# Patient Record
Sex: Female | Born: 1970 | Race: White | Hispanic: No | Marital: Single | Smoking: Current every day smoker
Health system: Southern US, Community
[De-identification: ages and names within clinical notes are randomized; demographics above are authoritative.]

## PROBLEM LIST (undated history)

## (undated) HISTORY — PX: ABDOMINAL HYSTERECTOMY: SHX81

## (undated) HISTORY — PX: CHOLECYSTECTOMY: SHX55

## (undated) HISTORY — PX: APPENDECTOMY: SHX54

---

## 2015-12-07 ENCOUNTER — Encounter: Payer: Self-pay | Admitting: Emergency Medicine

## 2015-12-07 ENCOUNTER — Emergency Department
Admission: EM | Admit: 2015-12-07 | Discharge: 2015-12-07 | Disposition: A | Discharge status: Home / Self Care | Payer: Self-pay | Attending: Emergency Medicine | Admitting: Emergency Medicine

## 2015-12-07 ENCOUNTER — Emergency Department: Payer: Self-pay

## 2015-12-07 DIAGNOSIS — R51 Headache: Secondary | ICD-10-CM | POA: Insufficient documentation

## 2015-12-07 DIAGNOSIS — F17200 Nicotine dependence, unspecified, uncomplicated: Secondary | ICD-10-CM | POA: Insufficient documentation

## 2015-12-07 DIAGNOSIS — R519 Headache, unspecified: Secondary | ICD-10-CM

## 2015-12-07 LAB — COMPREHENSIVE METABOLIC PANEL
ALBUMIN: 4.2 g/dL (ref 3.5–5.0)
ALT: 34 U/L (ref 14–54)
AST: 27 U/L (ref 15–41)
Alkaline Phosphatase: 70 U/L (ref 38–126)
Anion gap: 6 (ref 5–15)
BUN: 16 mg/dL (ref 6–20)
CHLORIDE: 105 mmol/L (ref 101–111)
CO2: 25 mmol/L (ref 22–32)
Calcium: 9.4 mg/dL (ref 8.9–10.3)
Creatinine, Ser: 0.78 mg/dL (ref 0.44–1.00)
GFR calc Af Amer: >60 mL/min (ref >60)
Glucose, Bld: 96 mg/dL (ref 65–99)
POTASSIUM: 4.2 mmol/L (ref 3.5–5.1)
SODIUM: 136 mmol/L (ref 135–145)
Total Bilirubin: 0.7 mg/dL (ref 0.3–1.2)
Total Protein: 7 g/dL (ref 6.5–8.1)

## 2015-12-07 LAB — CBC WITH DIFFERENTIAL/PLATELET
BASOS ABS: 0.1 10*3/uL (ref 0–0.1)
BASOS PCT: 1 %
EOS ABS: 0.1 10*3/uL (ref 0–0.7)
EOS PCT: 2 %
HCT: 41.7 % (ref 35.0–47.0)
Hemoglobin: 14.5 g/dL (ref 12.0–16.0)
Lymphocytes Relative: 35 %
Lymphs Abs: 2.2 10*3/uL (ref 1.0–3.6)
MCH: 31.9 pg (ref 26.0–34.0)
MCHC: 34.8 g/dL (ref 32.0–36.0)
MCV: 91.7 fL (ref 80.0–100.0)
MONO ABS: 0.7 10*3/uL (ref 0.2–0.9)
Monocytes Relative: 12 %
Neutro Abs: 3.3 10*3/uL (ref 1.4–6.5)
Neutrophils Relative %: 50 %
PLATELETS: 273 10*3/uL (ref 150–440)
RBC: 4.54 MIL/uL (ref 3.80–5.20)
RDW: 13.9 % (ref 11.5–14.5)
WBC: 6.4 10*3/uL (ref 3.6–11.0)

## 2015-12-07 MED ORDER — LORAZEPAM 1 MG PO TABS: 1.0000 mg | ORAL_TABLET | Freq: Two times a day (BID) | ORAL | 0 refills | Status: AC

## 2015-12-07 MED ORDER — LORAZEPAM 2 MG/ML IJ SOLN
1.0000 mg | Freq: Once | INTRAMUSCULAR | Status: AC
  Administered 2015-12-07: 1 mg via INTRAVENOUS
  Filled 2015-12-07: qty 1

## 2015-12-07 MED ORDER — KETOROLAC TROMETHAMINE 30 MG/ML IJ SOLN
30.0000 mg | Freq: Once | INTRAMUSCULAR | Status: AC
  Administered 2015-12-07: 30 mg via INTRAVENOUS
  Filled 2015-12-07: qty 1

## 2015-12-07 MED ORDER — SODIUM CHLORIDE 0.9 % IV SOLN
Freq: Once | INTRAVENOUS | Status: AC
  Administered 2015-12-07: 16:00:00 via INTRAVENOUS

## 2015-12-07 MED ORDER — BUTALBITAL-APAP-CAFFEINE 50-325-40 MG PO TABS: 1.0000 | ORAL_TABLET | Freq: Four times a day (QID) | ORAL | 0 refills | Status: AC | PRN

## 2015-12-07 MED ORDER — METOCLOPRAMIDE HCL 5 MG/ML IJ SOLN
10.0000 mg | Freq: Once | INTRAMUSCULAR | Status: AC
  Administered 2015-12-07: 10 mg via INTRAVENOUS
  Filled 2015-12-07: qty 2

## 2015-12-07 NOTE — ED Triage Notes (Signed)
Pt arrived to ED with c/o of headache that start 10 days ago. Pt states she has had n/v off and on and light sensitivity.

## 2015-12-07 NOTE — ED Notes (Signed)

## 2015-12-07 NOTE — ED Provider Notes (Signed)
[facility] Emergency Department Provider Note        Time seen: ----------------------------------------- 3:21 PM on 12/07/2015 -----------------------------------------    I have reviewed the triage vital signs and the nursing notes.   HISTORY  Chief Complaint Headache    HPI Jodi Riley is a 45 y.o. female who presents to the ER for left-sided headache that started 10 days ago. Patient states she's been under a lot of stress but is not sure if this has caused her headache. Patient states nothing has made it better and she has tried multiple over-the-counter medicines for this.Patient states the headache is around her left eye, light bothers her sound does not.   History reviewed. No pertinent past medical history.  There are no active problems to display for this patient.   Past Surgical History:  Procedure Laterality Date  . ABDOMINAL HYSTERECTOMY    . APPENDECTOMY    . CHOLECYSTECTOMY      Allergies Morphine and related  Social History Social History  Substance Use Topics  . Smoking status: Current Every Day Smoker    Packs/day: 0.50  . Smokeless tobacco: Never Used  . Alcohol use No    Review of Systems Constitutional: Negative for fever. Cardiovascular: Negative for chest pain. Respiratory: Negative for shortness of breath. Gastrointestinal: Negative for abdominal pain, vomiting and diarrhea. Genitourinary: Negative for dysuria. Musculoskeletal: Negative for back pain. Skin: Negative for rash. Neurological:Positive for headache  10-point ROS otherwise negative.  ____________________________________________   PHYSICAL EXAM:  VITAL SIGNS: ED Triage Vitals  Enc Vitals Group     BP 12/07/15 1413 109/83     Pulse Rate 12/07/15 1413 95     Resp 12/07/15 1413 18     Temp 12/07/15 1413 98.3 F (36.8 C)     Temp Source 12/07/15 1413 Oral     SpO2 12/07/15 1413 97 %     Weight 12/07/15 1414 160 lb (72.6 kg)   Height 12/07/15 1414 5\' 4"  (1.626 m)     Head Circumference --      Peak Flow --      Pain Score 12/07/15 1434 8     Pain Loc --      Pain Edu? --      Excl. in GC? --     Constitutional: Alert and oriented. Well appearing and in no distress. Eyes: Conjunctivae are normal. PERRL. Normal extraocular movements.Mild photophobia ENT   Head: Normocephalic and atraumatic.   Nose: No congestion/rhinnorhea.   Mouth/Throat: Mucous membranes are moist.   Neck: No stridor. Cardiovascular: Normal rate, regular rhythm. No murmurs, rubs, or gallops. Respiratory: Normal respiratory effort without tachypnea nor retractions. Breath sounds are clear and equal bilaterally. No wheezes/rales/rhonchi. Gastrointestinal: Soft and nontender. Normal bowel sounds Musculoskeletal: Nontender with normal range of motion in all extremities. No lower extremity tenderness nor edema. Neurologic:  Normal speech and language. No gross focal neurologic deficits are appreciated.  Skin:  Skin is warm, dry and intact. No rash noted. Psychiatric: Mood and affect are normal. Speech and behavior are normal.  ____________________________________________  ED COURSE:  Pertinent labs & imaging results that were available during my care of the patient were reviewed by me and considered in my medical decision making (see chart for details). Clinical Course  Patient is in no distress, we will assess with basic labs and likely CT imaging.  Procedures ____________________________________________   LABS (pertinent positives/negatives)  Labs Reviewed  CBC WITH DIFFERENTIAL/PLATELET  COMPREHENSIVE METABOLIC PANEL    RADIOLOGY  CT head Is unremarkable ____________________________________________  FINAL ASSESSMENT AND PLAN  Headache  Plan: Patient with labs and imaging as dictated above. Patient is in no acute distress, labs and CT are reassuring. Headache is likely stress related. I will prescribe Fioricet  and Ativan. She is stable for outpatient follow-up with her doctor.   Emily Filbert, MD   Note: This dictation was prepared with Dragon dictation. Any transcriptional errors that result from this process are unintentional    Emily Filbert, MD 12/07/15 [phone]

## 2017-10-21 IMAGING — CT CT HEAD W/O CM
3 series · 15 of 45 positions shown, 18 images · non-contrast
Comparison: None.

CLINICAL DATA: Headache for the past 10 days. Intermittent nausea,
vomiting and light sensitivity.

EXAM:
CT HEAD WITHOUT CONTRAST
TECHNIQUE: Contiguous axial images were obtained from the base of the skull
through the vertex without intravenous contrast.

[Series 2: head wo · axial · 0.40mm/px · z∈[-104,+11]mm · 9 of 28 slices shown, 12 images]
[im 3/28  brain]
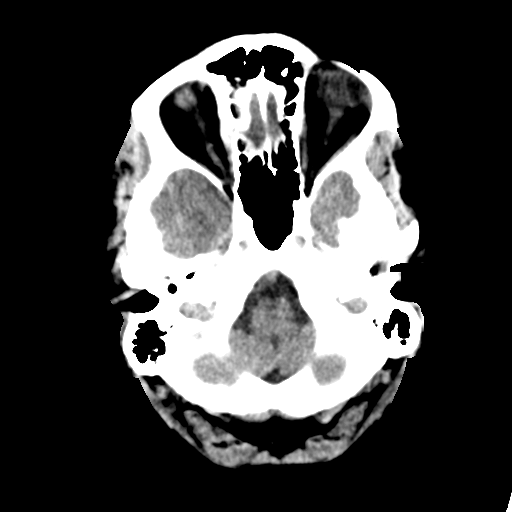
[im 3/28  bone]
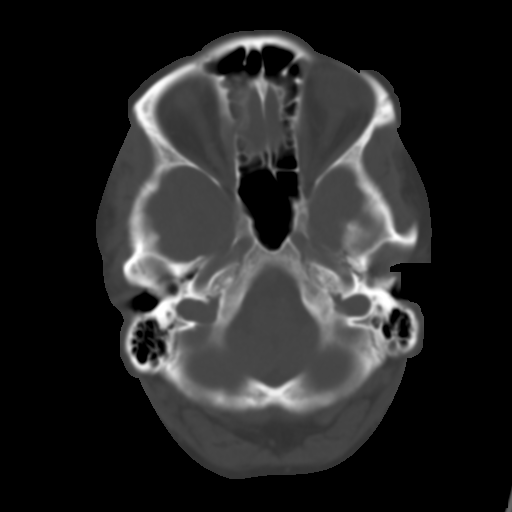
[im 6/28  brain]
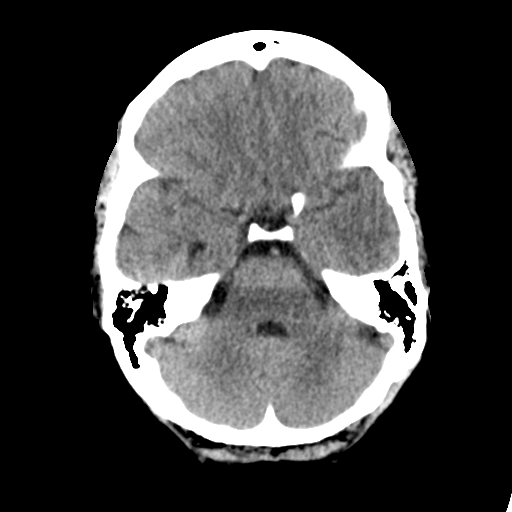
[im 9/28  brain]
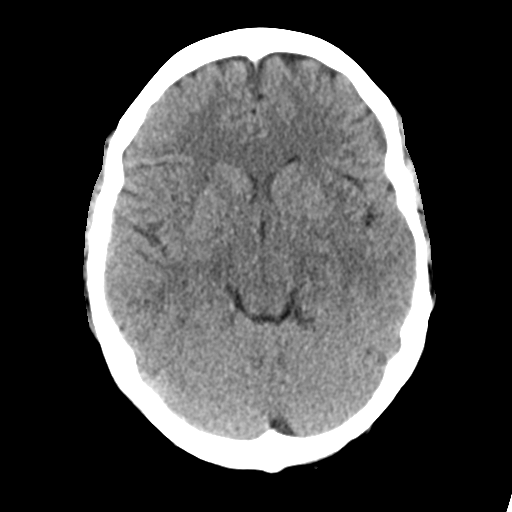
[im 12/28  brain]
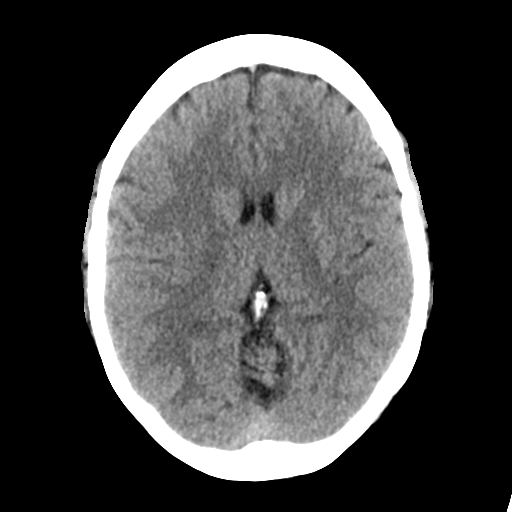
[im 15/28  brain]
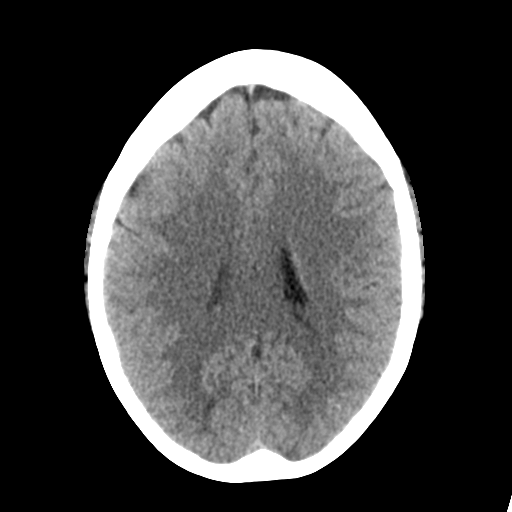
[im 15/28  bone]
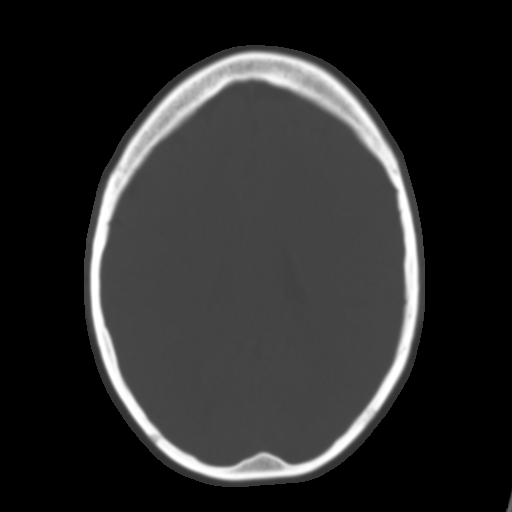
[im 17/28  brain]
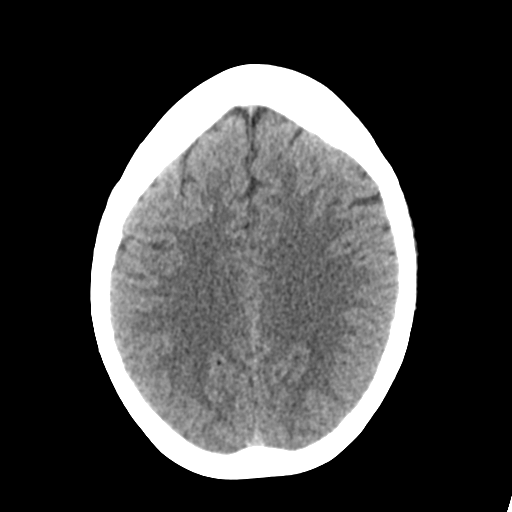
[im 20/28  brain]
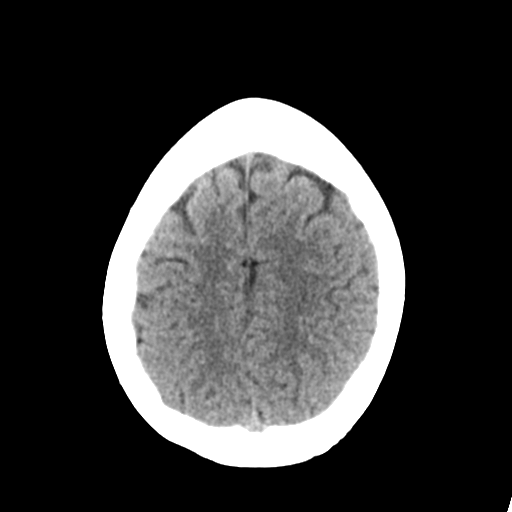
[im 23/28  brain]
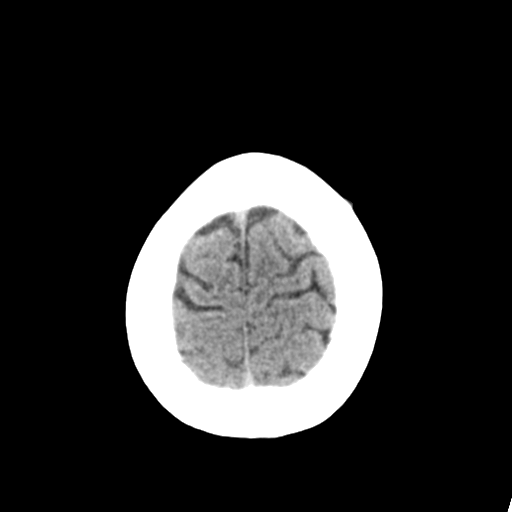
[im 26/28  brain]
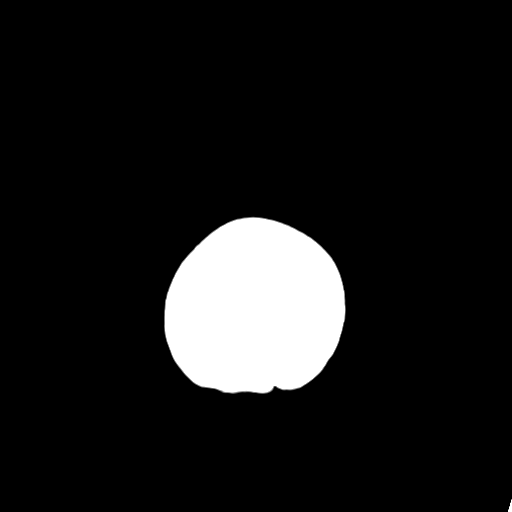
[im 26/28  bone]
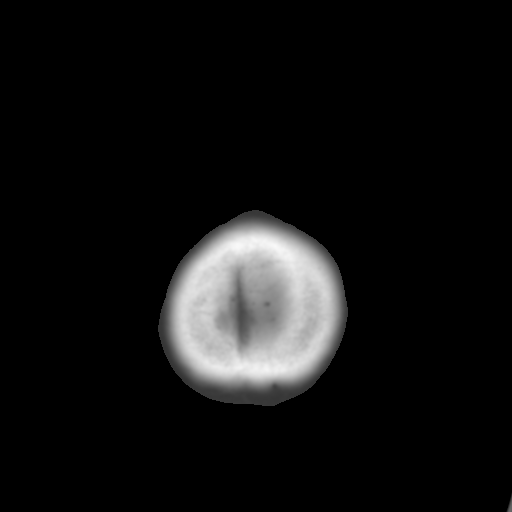

[Series 4: coronal soft tissue · coronal · 0.29mm/px · 3 of 62 slices shown]
[im 21/62  brain]
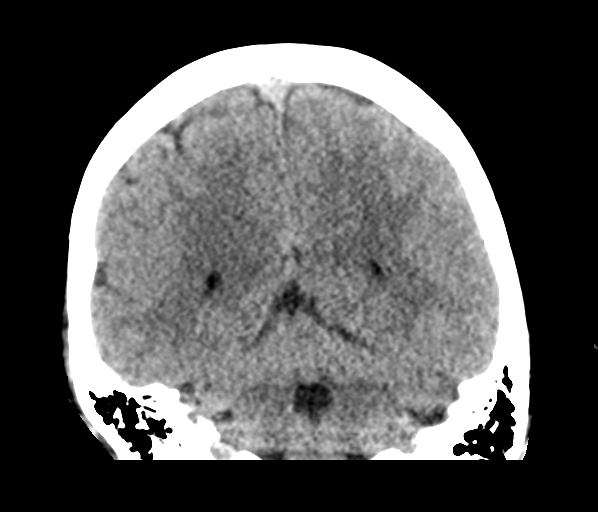
[im 28/62  brain]
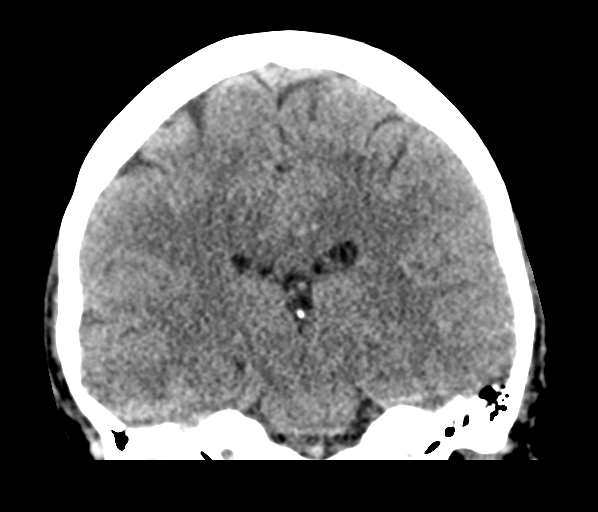
[im 34/62  brain]
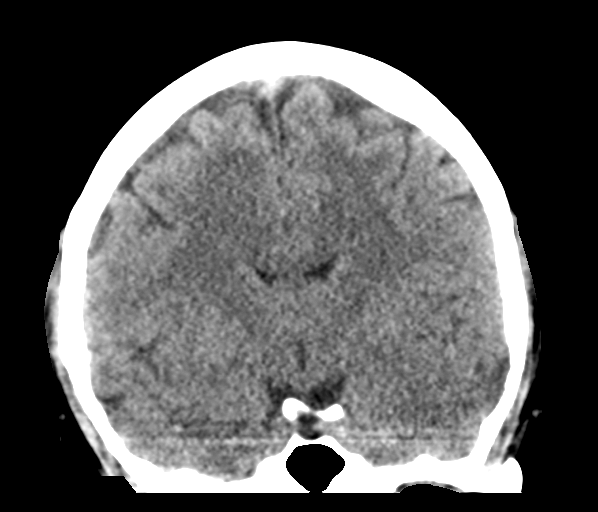

[Series 5: sagittal soft tissue · sagittal · 0.28mm/px · 3 of 49 slices shown]
[im 17/49  brain]
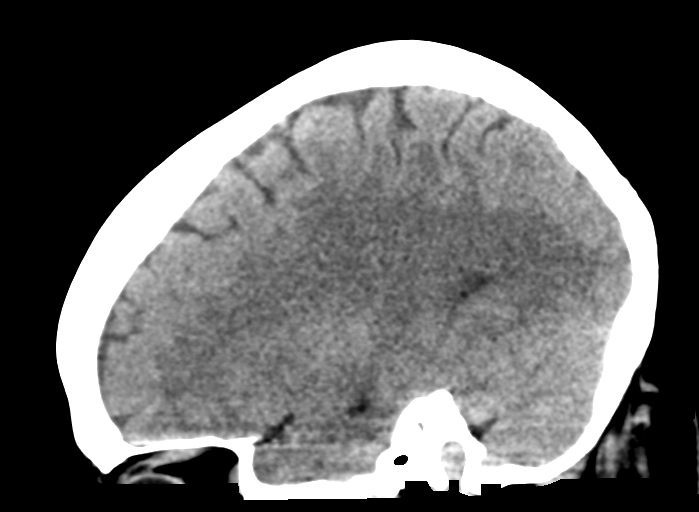
[im 25/49  brain]
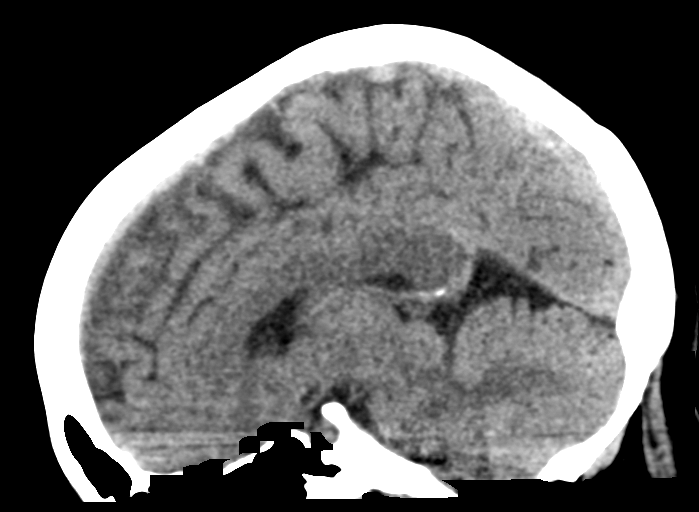
[im 33/49  brain]
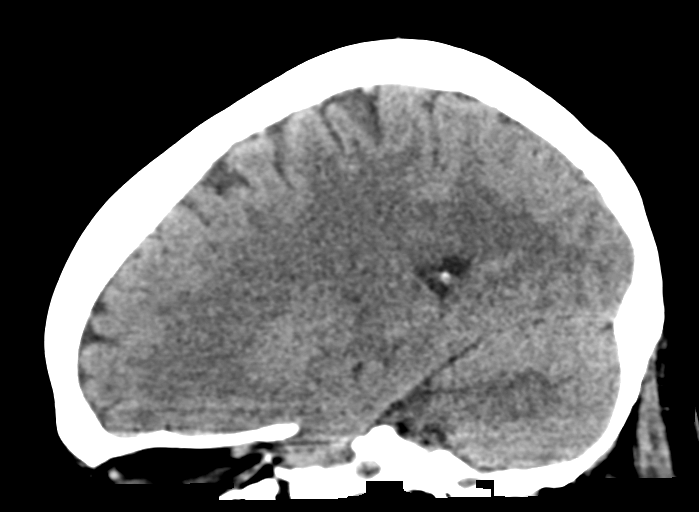

[15 of 45 positions shown; findings below may reference images not displayed]

FINDINGS: Brain: No evidence of acute infarction, hemorrhage, hydrocephalus,
extra-axial collection or mass lesion/mass effect.

Vascular: No hyperdense vessel or unexpected calcification.

Skull: Normal. Negative for fracture or focal lesion.

Sinuses/Orbits: No acute finding.

Other: None.
IMPRESSION: Normal examination.
